# Patient Record
Sex: Male | Born: 1975 | Race: White | Hispanic: No | Marital: Single | Smoking: Current every day smoker
Health system: Southern US, Community
[De-identification: ages and names within clinical notes are randomized; demographics above are authoritative.]

## PROBLEM LIST (undated history)

## (undated) DIAGNOSIS — F419 Anxiety disorder, unspecified: Secondary | ICD-10-CM

## (undated) DIAGNOSIS — F329 Major depressive disorder, single episode, unspecified: Secondary | ICD-10-CM

## (undated) DIAGNOSIS — R200 Anesthesia of skin: Secondary | ICD-10-CM

## (undated) DIAGNOSIS — F32A Depression, unspecified: Secondary | ICD-10-CM

## (undated) HISTORY — DX: Depression, unspecified: F32.A

## (undated) HISTORY — DX: Anxiety disorder, unspecified: F41.9

## (undated) HISTORY — DX: Anesthesia of skin: R20.0

## (undated) HISTORY — DX: Major depressive disorder, single episode, unspecified: F32.9

## (undated) HISTORY — PX: FRACTURE SURGERY: SHX138

---

## 2005-07-11 ENCOUNTER — Ambulatory Visit: Payer: Self-pay | Admitting: Podiatry

## 2009-07-24 ENCOUNTER — Inpatient Hospital Stay: Payer: Self-pay | Admitting: Podiatry

## 2011-03-02 IMAGING — US US SOFT TISSUE EXCLUDE HEAD/NECK
1 series · 15 of 15 positions shown · non-contrast
Comparison: none

REASON FOR EXAM: foreign body
COMMENTS:

PROCEDURE:     US  - US SOFT TISSUE, NOT NECK /  HEAD  - July 24, 2009 [DATE]
RESULT:     Focus left foot ultrasound dated 07/24/2009.
TECHNIQUE: Post evaluation left foot was obtained ultrasound.

[Series 1: us soft tissue exclude head/neck · 15 of 15 slices shown]
[im 1/15]
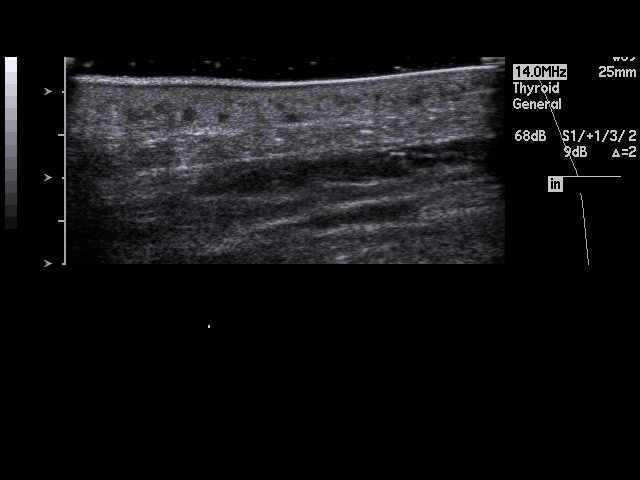
[im 2/15]
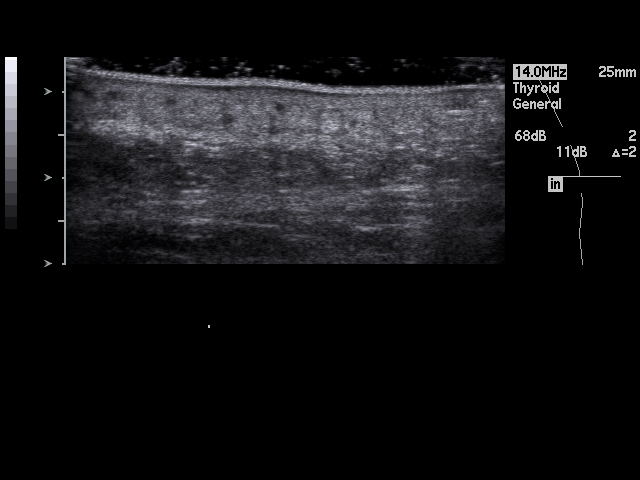
[im 3/15]
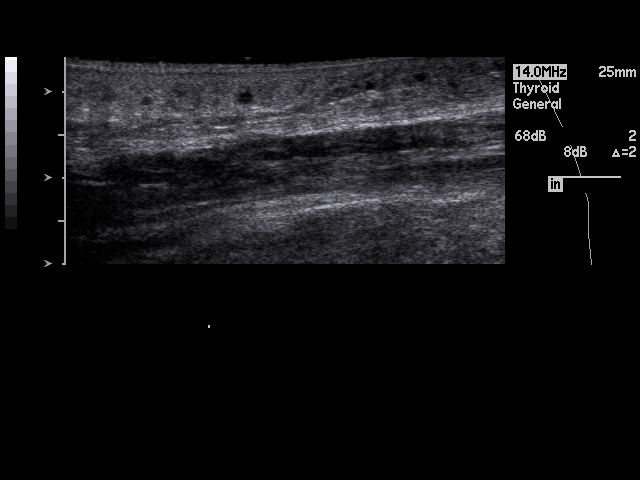
[im 4/15]
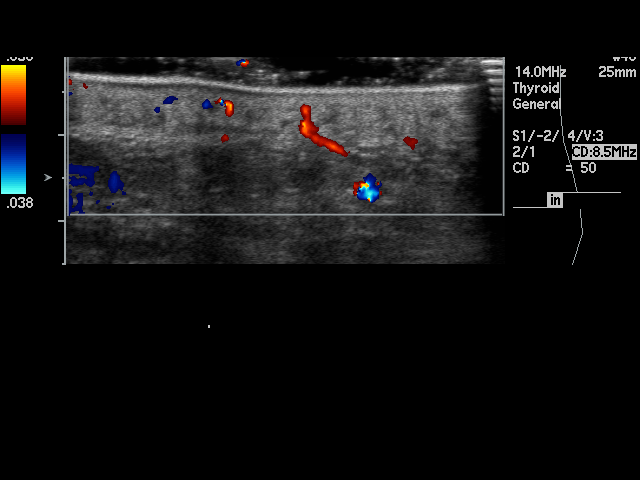
[im 5/15]
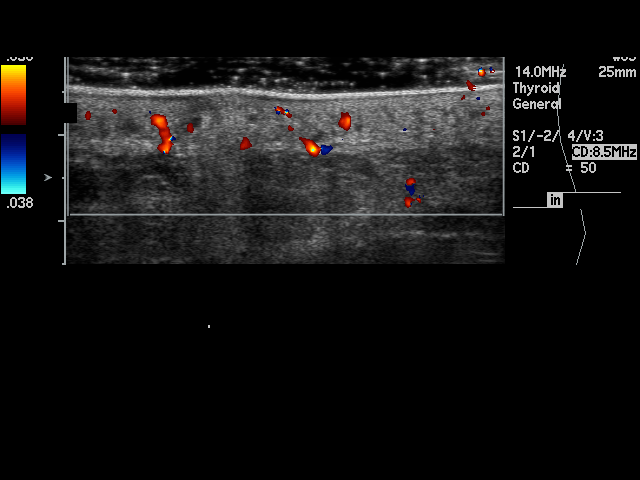
[im 6/15]
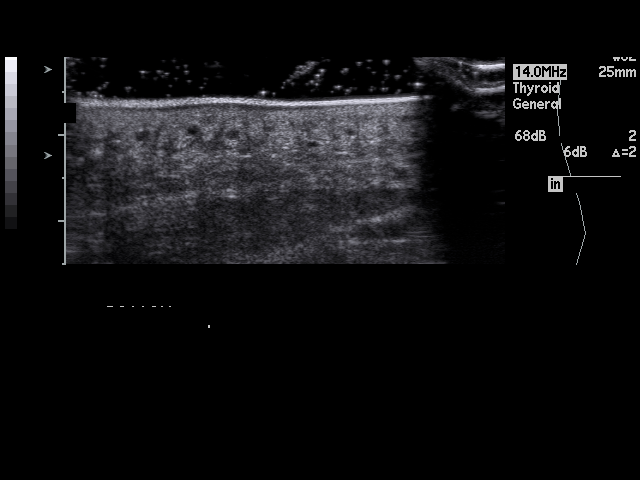
[im 7/15]
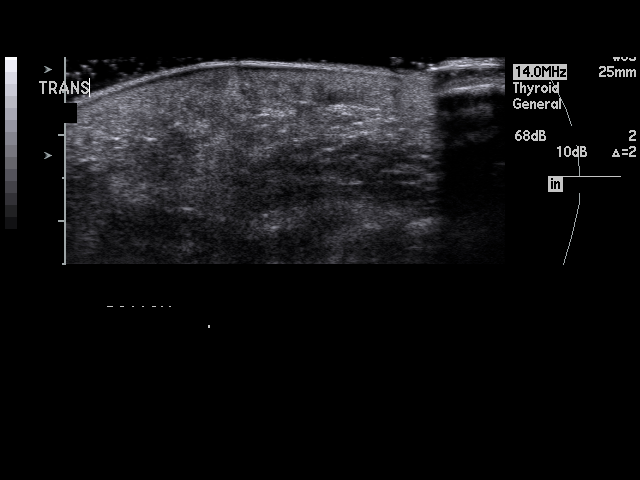
[im 8/15]
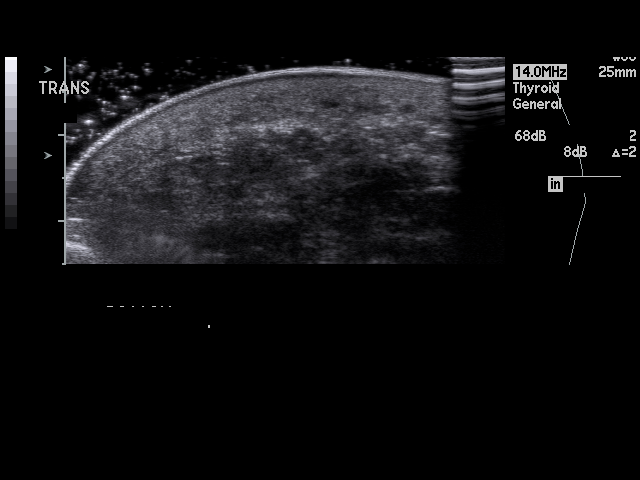
[im 9/15]
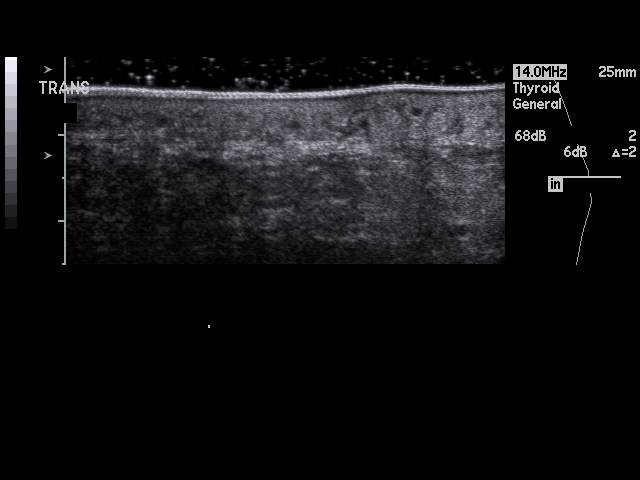
[im 10/15]
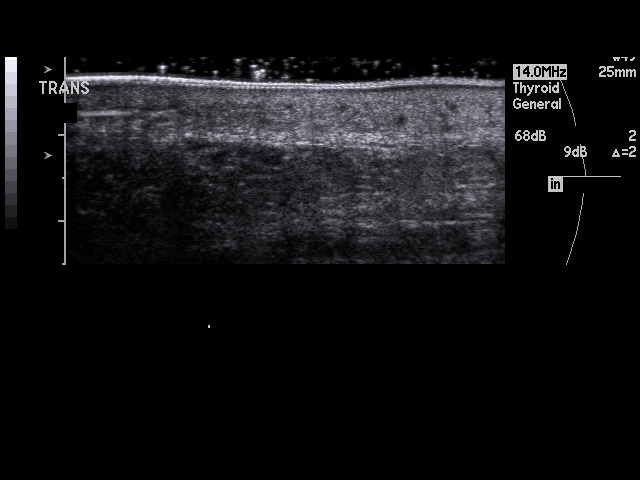
[im 11/15]
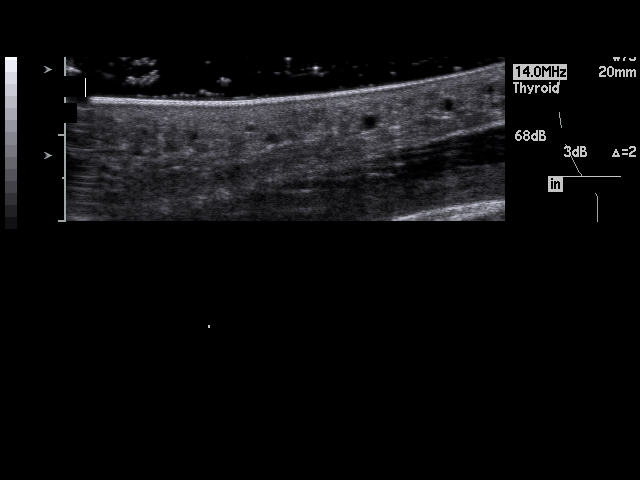
[im 12/15]
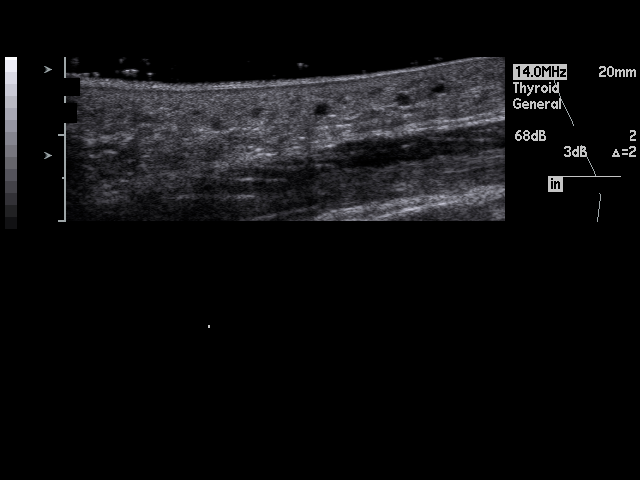
[im 13/15]
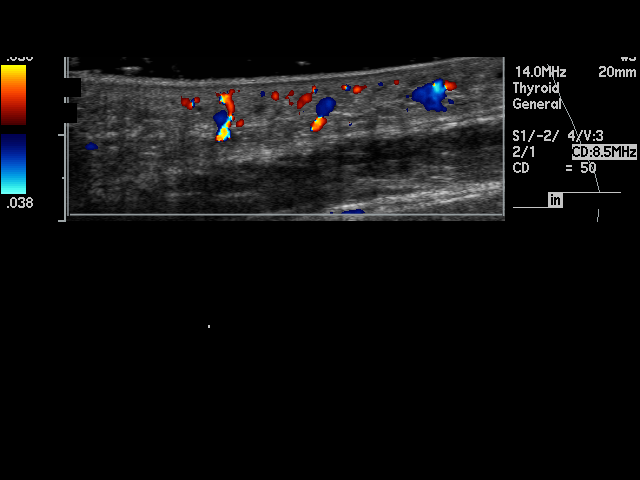
[im 14/15]
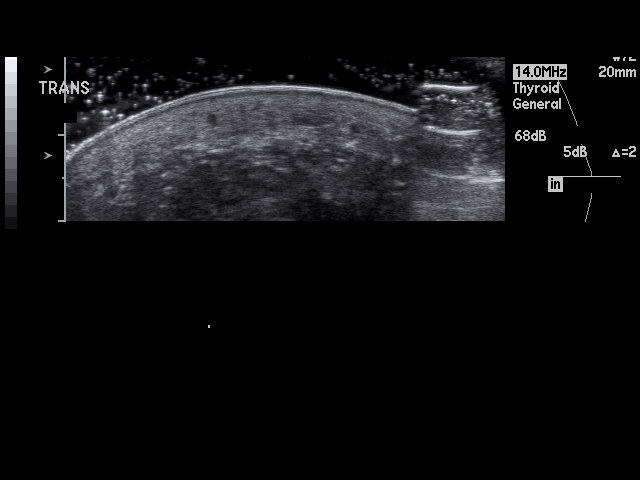
[im 15/15]
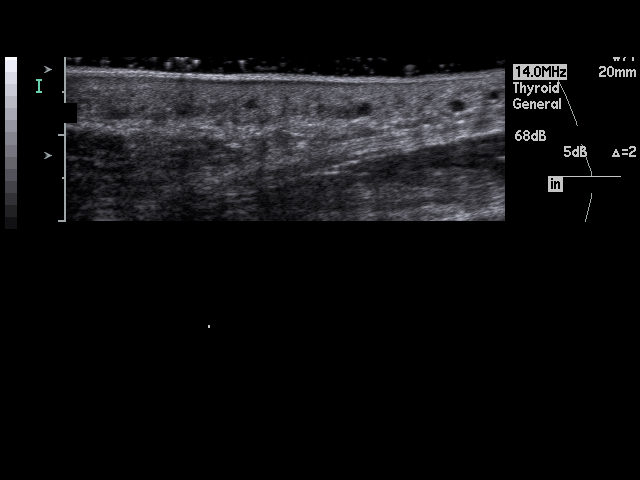

[15 of 15 positions shown; findings below may reference images not displayed]

FINDINGS: No solid or cystic sonographic abnormalities identified. No areas
of increased echogenicity nor hypotonicity identified to suggest sequela of
a foreign body. The interrogated soft tissues demonstrate no sonographic
abnormalities.
IMPRESSION: Unremarkable focused ultrasound of the left foot.

## 2014-08-25 DIAGNOSIS — F909 Attention-deficit hyperactivity disorder, unspecified type: Secondary | ICD-10-CM | POA: Insufficient documentation

## 2016-06-16 ENCOUNTER — Emergency Department
Admission: EM | Admit: 2016-06-16 | Discharge: 2016-06-16 | Disposition: A | Discharge status: Home / Self Care | Payer: No Typology Code available for payment source | Attending: Emergency Medicine | Admitting: Emergency Medicine

## 2016-06-16 DIAGNOSIS — M7918 Myalgia, other site: Secondary | ICD-10-CM

## 2016-06-16 DIAGNOSIS — M546 Pain in thoracic spine: Secondary | ICD-10-CM | POA: Diagnosis not present

## 2016-06-16 DIAGNOSIS — S161XXA Strain of muscle, fascia and tendon at neck level, initial encounter: Secondary | ICD-10-CM | POA: Diagnosis not present

## 2016-06-16 DIAGNOSIS — Y939 Activity, unspecified: Secondary | ICD-10-CM | POA: Diagnosis not present

## 2016-06-16 DIAGNOSIS — S199XXA Unspecified injury of neck, initial encounter: Secondary | ICD-10-CM | POA: Diagnosis present

## 2016-06-16 DIAGNOSIS — Y92410 Unspecified street and highway as the place of occurrence of the external cause: Secondary | ICD-10-CM | POA: Diagnosis not present

## 2016-06-16 DIAGNOSIS — Y999 Unspecified external cause status: Secondary | ICD-10-CM | POA: Insufficient documentation

## 2016-06-16 DIAGNOSIS — M25511 Pain in right shoulder: Secondary | ICD-10-CM | POA: Insufficient documentation

## 2016-06-16 DIAGNOSIS — F17210 Nicotine dependence, cigarettes, uncomplicated: Secondary | ICD-10-CM | POA: Insufficient documentation

## 2016-06-16 MED ORDER — KETOROLAC TROMETHAMINE 60 MG/2ML IM SOLN
60.0000 mg | Freq: Once | INTRAMUSCULAR | Status: AC
  Administered 2016-06-16: 60 mg via INTRAMUSCULAR
  Filled 2016-06-16: qty 2

## 2016-06-16 MED ORDER — TRAMADOL HCL 50 MG PO TABS: 50.0000 mg | ORAL_TABLET | Freq: Four times a day (QID) | ORAL | 0 refills | Status: AC | PRN

## 2016-06-16 MED ORDER — IBUPROFEN 600 MG PO TABS: 600.0000 mg | ORAL_TABLET | Freq: Three times a day (TID) | ORAL | 0 refills | Status: AC | PRN

## 2016-06-16 MED ORDER — METHOCARBAMOL 750 MG PO TABS: 750.0000 mg | ORAL_TABLET | Freq: Four times a day (QID) | ORAL | 0 refills | Status: DC
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
## 2018-06-22 ENCOUNTER — Ambulatory Visit: Payer: Self-pay

## 2018-06-24 ENCOUNTER — Ambulatory Visit: Payer: Self-pay | Admitting: Adult Health Nurse Practitioner

## 2018-06-24 DIAGNOSIS — Z0000 Encounter for general adult medical examination without abnormal findings: Secondary | ICD-10-CM

## 2018-06-24 DIAGNOSIS — F331 Major depressive disorder, recurrent, moderate: Secondary | ICD-10-CM

## 2018-06-24 DIAGNOSIS — F419 Anxiety disorder, unspecified: Secondary | ICD-10-CM | POA: Insufficient documentation

## 2018-06-24 DIAGNOSIS — F32A Depression, unspecified: Secondary | ICD-10-CM | POA: Insufficient documentation

## 2018-06-24 DIAGNOSIS — F329 Major depressive disorder, single episode, unspecified: Secondary | ICD-10-CM | POA: Insufficient documentation

## 2018-06-24 MED ORDER — SERTRALINE HCL 50 MG PO TABS: 50.0000 mg | ORAL_TABLET | Freq: Every day | ORAL | 1 refills | Status: DC

## 2018-06-24 NOTE — Progress Notes (Signed)
Patient: Wayne PinksScott A Ratti Male    DOB: 28-Jan-1976   42 y.o.   MRN: [id number] Visit Date: 06/24/2018  Today's Provider: Jacelyn Pieah Doles-Johnson, NP   Chief Complaint  Patient presents with  . Medication Refill    patient needs medications, patient is requesting adderall   Subjective:    HPI  Recently moved here from [place], not by his own choice.  States that he was waiting to see a Psychiatrist  Pt states that he is about to run out of medications- states that his anxiety is high because his wife recently left with his one year old son and sold their home in [place] and now he is back in [place] living with his parents until he can get on his feet.  He feels like he is starting over again.  Denies SI/HI.   Used to see Dr. Terance HartBronstein in the past.  Takes Ativan PRN- but recently taking more consistently due to his situation.  States he needs Adderall to focus and manage his daily life.  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       No Known Allergies Previous Medications   IBUPROFEN (ADVIL,MOTRIN) 600 MG TABLET    Take 1 tablet (600 mg total) by mouth every 8 (eight) hours as needed.   LORAZEPAM (ATIVAN) 0.5 MG TABLET    Take 0.5 mg by mouth every 8 (eight) hours.   METHOCARBAMOL (ROBAXIN-750) 750 MG TABLET    Take 1 tablet (750 mg total) by mouth 4 (four) times daily.   NIFEDIPINE (ADALAT CC) 30 MG 24 HR TABLET    Take 30 mg by mouth daily.   SERTRALINE (ZOLOFT) 50 MG TABLET    Take 50 mg by mouth daily.    Review of Systems  All other systems reviewed and are negative.   Social History   Tobacco Use  . Smoking status: Current Every Day Smoker    Packs/day: 0.50    Types: Cigarettes  . Smokeless tobacco: Never Used  Substance Use Topics  . Alcohol use: Yes    Alcohol/week: 6.0 standard drinks    Types: 6 Cans of beer per week   Objective:   BP 139/89 (BP Location: Left Arm, Patient Position: Sitting, Cuff Size: Normal)   Pulse (!) 107   Temp  97.6 F (36.4 C) (Oral)   Ht 6\' 1"  (1.854 m)   Wt 165 lb 9.6 oz (75.1 kg)   BMI 21.85 kg/m   Physical Exam Vitals signs reviewed.  Constitutional:      Appearance: Normal appearance.  HENT:     Head: Normocephalic.  Cardiovascular:     Rate and Rhythm: Normal rate and regular rhythm.  Pulmonary:     Effort: Pulmonary effort is normal.     Breath sounds: Normal breath sounds.  Abdominal:     General: Bowel sounds are normal.     Palpations: Abdomen is soft.  Skin:    General: Skin is warm and dry.  Neurological:     Mental Status: He is alert and oriented to person, place, and time.  Psychiatric:     Comments: Mildly anxious         Assessment & Plan:        [facility]  Will RX for Sertraline.   Routine labs ordered. Will call with lab results.  FU as needed.      Jacelyn Pieah Doles-Johnson, NP   [facility]

## 2018-06-25 LAB — COMPREHENSIVE METABOLIC PANEL
ALT: 13 IU/L (ref 0–44)
AST: 19 IU/L (ref 0–40)
Albumin/Globulin Ratio: 1.4 (ref 1.2–2.2)
Albumin: 4.6 g/dL (ref 3.5–5.5)
Alkaline Phosphatase: 69 IU/L (ref 39–117)
BUN/Creatinine Ratio: 17 (ref 9–20)
BUN: 14 mg/dL (ref 6–24)
Bilirubin Total: 0.4 mg/dL (ref 0.0–1.2)
CO2: 26 mmol/L (ref 20–29)
Calcium: 9.6 mg/dL (ref 8.7–10.2)
Chloride: 99 mmol/L (ref 96–106)
Creatinine, Ser: 0.84 mg/dL (ref 0.76–1.27)
GFR calc non Af Amer: 108 mL/min/{1.73_m2} (ref >59)
GFR, EST AFRICAN AMERICAN: 125 mL/min/{1.73_m2} (ref >59)
GLOBULIN, TOTAL: 3.2 g/dL (ref 1.5–4.5)
Glucose: 93 mg/dL (ref 65–99)
Potassium: 4.3 mmol/L (ref 3.5–5.2)
Sodium: 142 mmol/L (ref 134–144)
Total Protein: 7.8 g/dL (ref 6.0–8.5)

## 2018-06-25 LAB — TSH: TSH: 0.798 u[IU]/mL (ref 0.450–4.500)

## 2018-06-25 LAB — CBC
Hematocrit: 45.3 % (ref 37.5–51.0)
Hemoglobin: 15.5 g/dL (ref 13.0–17.7)
MCH: 32 pg (ref 26.6–33.0)
MCHC: 34.2 g/dL (ref 31.5–35.7)
MCV: 93 fL (ref 79–97)
Platelets: 266 10*3/uL (ref 150–450)
RBC: 4.85 x10E6/uL (ref 4.14–5.80)
RDW: 13 % (ref 12.3–15.4)
WBC: 9.3 10*3/uL (ref 3.4–10.8)

## 2018-06-25 LAB — LIPID PANEL
Chol/HDL Ratio: 3.5 ratio (ref 0.0–5.0)
Cholesterol, Total: 219 mg/dL — ABNORMAL HIGH (ref 100–199)
HDL: 63 mg/dL (ref >39)
LDL Calculated: 141 mg/dL — ABNORMAL HIGH (ref 0–99)
Triglycerides: 75 mg/dL (ref 0–149)
VLDL Cholesterol Cal: 15 mg/dL (ref 5–40)

## 2018-06-29 ENCOUNTER — Ambulatory Visit: Payer: Self-pay | Admitting: Licensed Clinical Social Worker

## 2018-06-29 DIAGNOSIS — F411 Generalized anxiety disorder: Secondary | ICD-10-CM

## 2018-06-29 DIAGNOSIS — F331 Major depressive disorder, recurrent, moderate: Secondary | ICD-10-CM

## 2018-06-29 NOTE — BH Specialist Note (Signed)
Integrated Behavioral Health Comprehensive Clinical Assessment  MRN: [id number] Name: DERICO MITTON  Type of Service: Integrated Behavioral Health-Individual Interpretor: No. Interpretor Name and Language: Not applicable.  PRESENTING CONCERNS: SOHAIL CAPRARO is a 42 y.o. male accompanied by himself.Lars Pinks was referred to [facility] clinician for mental health.  Previous mental health services Have you ever been treated for a mental health problem? Yes If "Yes", when were you treated and whom did you see? Mr. Wach has only been treated by his former primary care doctor who prescribed Adderall 20 mg daily fast acting and Sertraline 50 mg daily for anxiety and depression. Have you ever been hospitalized for mental health treatment? No Have you ever been treated for any of the following? Past Psychiatric History/Hospitalization(s): Anxiety: Yes Mr. Bushey reports that his anxiety started about four in half to five years ago around the time of his divorce. He describes excessive worrying daily, worrying too much about things, trouble relaxing, feeling so restless its hard to sit still, becoming easily annoyed or irritable, and feeling afraid as if something awful might happen. Bipolar Disorder: Negative Depression: Yes Mr. Shatto reports that his symptoms of depression started about four in half to five years ago as evidenced by feeling down and depressed more than half the day, loss of interest in previously enjoyed activities, increased appetite, difficulty concentrating, and restlessness. He denies suicidal and homicidal thoughts.  Mania: Negative Psychosis: Negative Schizophrenia: Negative Personality Disorder: Negative Hospitalization for psychiatric illness: Negative History of Electroconvulsive Shock Therapy: Negative Prior Suicide Attempts: Negative Have you ever had thoughts of harming yourself or others or attempted suicide? No plan to harm self or  others  Medical history  has a past medical history of Anxiety and Depression. Primary Care Physician: Patient, No Pcp Per Date of last physical exam: 06/24/18 Allergies: No Known Allergies Current medications:  Outpatient Encounter Medications as of 06/29/2018  Medication Sig  . ibuprofen (ADVIL,MOTRIN) 600 MG tablet Take 1 tablet (600 mg total) by mouth every 8 (eight) hours as needed.  Marland Kitchen LORazepam (ATIVAN) 0.5 MG tablet Take 0.5 mg by mouth every 8 (eight) hours.  . methocarbamol (ROBAXIN-750) 750 MG tablet Take 1 tablet (750 mg total) by mouth 4 (four) times daily. (Patient not taking: Reported on 06/24/2018)  . NIFEdipine (ADALAT CC) 30 MG 24 hr tablet Take 30 mg by mouth daily.  . sertraline (ZOLOFT) 50 MG tablet Take 1 tablet (50 mg total) by mouth daily.   No facility-administered encounter medications on file as of 06/29/2018.    Have you ever had any serious medication reactions? No Is there any history of mental health problems or substance abuse in your family? No Has anyone in your family been hospitalized for mental health treatment? No  Social/family history Who lives in your current household? Mr. Deal lives with his parents in a house they own. What is your family of origin, childhood history? Mr. Doo is from [place] . Where were you born? Mr. Ritthaler was born and raised in [place]. Where did you grow up? Mr. Alms raised in [place]. How many different homes have you lived in? A few Describe your childhood: Mr. Haynesworth describes his childhood as being wonderful.  Do you have siblings, step/half siblings? Yes- He has a sister and they are four years apart in age. What are their names, relation, sex, age? See above Are your parents separated or divorced? No What are your social supports? Mr. Defranco has  the support of his family and two best friends.  Education How many grades have you completed? 11th grade Did you have any problems in school?  No  Employment/financial issues Mr. 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   Sleep Usual bedtime varies.  Sleeping arrangements: Mr. Irving BurtonHodge sleeps alone. Problems with snoring: Not known Obstructive sleep apnea is not a concern. Problems with nightmares: No Problems with night terrors: No Problems with sleepwalking: No  Trauma/Abuse history Have you ever experienced or been exposed to any form of abuse? No Have you ever experienced or been exposed to something traumatic? No  Substance use Do you use alcohol, nicotine or caffeine? tobacco use: 0.5 cigarettes on and off for several years. How old were you when you first tasted alcohol? Teens. Have you ever used illicit drugs or abused prescription medications? Mr. Irving BurtonHodge denies using or abusing alcohol or other drugs.  Mental status General appearance/Behavior: Casual Eye contact: Fair Motor behavior: Restlestness Speech: Normal Level of consciousness: Alert Mood: Euthymic Affect: Appropriate Anxiety level: Minimal Thought process: Coherent Thought content: WNL Perception: Normal Judgment: Good Insight: Present  Diagnosis No diagnosis found.  GOALS ADDRESSED: Patient will reduce symptoms of: anxiety, depression and stress and increase knowledge and/or ability of: self-management skills and stress reduction and also: Increase healthy adjustment to current life circumstances              INTERVENTIONS: Interventions utilized: Biopsychosocial assessment completed. Standardized Assessments completed: GAD-7 and PHQ 9   ASSESSMENT/OUTCOME:  Avaneesh A. Irving BurtonHodge is a 42 year old Caucasian male who presents today for a mental health assessment and was referred by Dellie Burnseah Doles Johnon NP at [facility]. Mr. Irving BurtonHodge reports that he was previously seeing his primary care doctor for mental health until he lost his job and health insurance. He reports  that Dr. Terance HartBronstein was prescribing him Sertraline 50 mg daily for depression and anxiety and Adderall 20 mg daily fast acting for his symptoms of ADHD. He reports that he was diagnosed with ADHD in third grade. He reports that he has been on Sertraline and Adderall for 13 to 14 years. He reports that his depression and anxiety started around the time of his divorce four in half to five years ago. He denies ever being hospitalized for mental illness or substance abuse. He denies ever abusing alcohol or other drugs. He denies any symptoms of psychosis. He has not previously seen a therapist or psychiatrist for mental health.  Mr. Myrtie NeitherHodge's medical history is unremarkable. He is a new patient at [facility]. He denies any allergies or adverse drug reactions.   Mr. Irving BurtonHodge lives with his parents. 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 He relies on his parents to help him financially. He reports that he will occasionally get odd jobs from friends who own their own businesses. 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 He was previously married for twenty two years and went through a divorce four in half to five years ago. He has two children. One child from his first marriage who is [id number] and another son who is one from his fiance of four years. He reports that his fiance left him in November of this year, sold the house, sold his stuff, and took their one year  old son. He explains that he has contacted her family and found out that she is living in [place] but she refuses to speak to him. He plans to get a lawyer to pursue custody. He relies on the support of his parents and two best friends. He denies a history of mental illness or substance abuse in his family.   PLAN: Referral to [facility] for seeing a psychiatrist about Adderall. Sertraline prescription prescribed by Jacelyn Pi NP at [facility] until Mr. Artola can establish care  at [facility].   Scheduled next visit: no visit scheduled.   Althia Forts Clinical Social Work

## 2018-07-22 ENCOUNTER — Ambulatory Visit: Payer: Self-pay | Admitting: Urology

## 2018-07-22 VITALS — BP 121/69 | HR 87 | Temp 97.7°F | Ht 73.25 in | Wt 171.9 lb

## 2018-07-22 DIAGNOSIS — E7800 Pure hypercholesterolemia, unspecified: Secondary | ICD-10-CM

## 2018-07-22 NOTE — Progress Notes (Signed)
Patient: Wayne Sullivan Male    DOB: [birth year]   43 y.o.   MRN: [id number] Visit Date: 07/22/2018  Today's Provider: Michiel Cowboy, PA-C   Chief Complaint  Patient presents with  . Follow-up    cholesterol check  . Numbness    left arm numbness   Subjective:    HPI He states that his two middle fingers turn purple and go numb and the tingling sensation travels up his ulnar side to his upper arm to the shoulder.  He states during one episode of numbness he was able to stick a needle into his deltoid and not feel it.  He states that this has been happening since he was 43 years old.  He was prescribed nifedipine for the symptoms.    He has not had any fevers or chills.  WBC count was 9.3 on 07/02/2018.    His total cholesterol is 219.  His LDL was 141 and HDL 63 mL.    BP was equal in both arms     No Known Allergies Previous Medications   IBUPROFEN (ADVIL,MOTRIN) 600 MG TABLET    Take 1 tablet (600 mg total) by mouth every 8 (eight) hours as needed.   LORAZEPAM (ATIVAN) 0.5 MG TABLET    Take 0.5 mg by mouth every 8 (eight) hours.   METHOCARBAMOL (ROBAXIN-750) 750 MG TABLET    Take 1 tablet (750 mg total) by mouth 4 (four) times daily.   NIFEDIPINE (ADALAT CC) 30 MG 24 HR TABLET    Take 30 mg by mouth daily.   SERTRALINE (ZOLOFT) 50 MG TABLET    Take 1 tablet (50 mg total) by mouth daily.    Review of Systems  Constitutional: Negative.   HENT: Negative.   Eyes: Negative.   Respiratory: Negative.   Cardiovascular: Negative.   Gastrointestinal: Negative.   Endocrine: Negative.   Genitourinary: Negative.   Musculoskeletal: Negative.   Skin: Negative.   Allergic/Immunologic: Negative.   Neurological: Positive for numbness.       Intermittent left arm numbness for several years   Hematological: Negative.   Psychiatric/Behavioral: The patient is nervous/anxious.     Social History   Tobacco Use  . Smoking status: Current Every Day Smoker    Packs/day: 0.50    Types:  Cigarettes  . Smokeless tobacco: Never Used  Substance Use Topics  . Alcohol use: Yes    Alcohol/week: 6.0 standard drinks    Types: 6 Cans of beer per week   Objective:   BP 121/69 (BP Location: Left Arm, Patient Position: Sitting, Cuff Size: Normal)   Pulse 87   Temp 97.7 F (36.5 C) (Oral)   Ht 6' 1.25" (1.861 m)   Wt 171 lb 14.4 oz (78 kg)   BMI 22.52 kg/m   Physical Exam Constitutional:      Appearance: Normal appearance. He is normal weight.  HENT:     Head: Normocephalic and atraumatic.     Nose: Nose normal.     Mouth/Throat:     Mouth: Mucous membranes are moist.  Eyes:     Extraocular Movements: Extraocular movements intact.     Conjunctiva/sclera: Conjunctivae normal.     Pupils: Pupils are equal, round, and reactive to light.  Neck:     Musculoskeletal: Normal range of motion and neck supple.  Cardiovascular:     Rate and Rhythm: Regular rhythm.     Pulses: Normal pulses.     Heart sounds: Normal heart sounds.  Pulmonary:  Effort: Pulmonary effort is normal.     Breath sounds: Normal breath sounds.  Abdominal:     General: Abdomen is flat. Bowel sounds are normal.  Neurological:     Mental Status: He is alert.  Psychiatric:        Mood and Affect: Mood normal.        Behavior: Behavior normal.        Thought Content: Thought content normal.        Judgment: Judgment normal.         Assessment & Plan:   1. ?Cervical radiculopathy  - appt. With Dr. Justice RocherFossier   2.  ADHD  - needs to get his Adderall   - states [facility] refused to give him the medication    3. High cholesterol  - discussed diet and advised to follow a low cholesterol diet  - recheck lipids in one month         Michiel CowboySHANNON Maddyn Lieurance, PA-C   [facility]

## 2018-07-22 NOTE — Patient Instructions (Signed)
Cholesterol  Cholesterol is a fat. Your body needs a small amount of cholesterol. Cholesterol (plaque) may build up in your blood vessels (arteries). That makes you more likely to have a heart attack or stroke. You cannot feel your cholesterol level. Having a blood test is the only way to find out if your level is high. Keep your test results. Work with your doctor to keep your cholesterol at a good level. What do the results mean?  Total cholesterol is how much cholesterol is in your blood.  LDL is bad cholesterol. This is the type that can build up. Try to have low LDL.  HDL is good cholesterol. It cleans your blood vessels and carries LDL away. Try to have high HDL.  Triglycerides are fat that the body can store or burn for energy. What are good levels of cholesterol?  Total cholesterol below 200.  LDL below 100 is good for people who have health risks. LDL below 70 is good for people who have very high risks.  HDL above 40 is good. It is best to have HDL of 60 or higher.  Triglycerides below 150. How can I lower my cholesterol? Diet Follow your diet program as told by your doctor.  Choose fish, white meat chicken, or [place] that is roasted or baked. Try not to eat red meat, fried foods, sausage, or lunch meats.  Eat lots of fresh fruits and vegetables.  Choose whole grains, beans, pasta, potatoes, and cereals.  Choose olive oil, corn oil, or canola oil. Only use small amounts.  Try not to eat butter, mayonnaise, shortening, or palm kernel oils.  Try not to eat foods with trans fats.  Choose low-fat or nonfat dairy foods. ? Drink skim or nonfat milk. ? Eat low-fat or nonfat yogurt and cheeses. ? Try not to drink whole milk or cream. ? Try not to eat ice cream, egg yolks, or full-fat cheeses.  Healthy desserts include angel food cake, ginger snaps, animal crackers, hard candy, popsicles, and low-fat or nonfat frozen yogurt. Try not to eat pastries, cakes, pies, and  cookies.  Exercise Follow your exercise program as told by your doctor.  Be more active. Try gardening, walking, and taking the stairs.  Ask your doctor about ways that you can be more active. Medicine  Take over-the-counter and prescription medicines only as told by your doctor. This information is not intended to replace advice given to you by your health care provider. Make sure you discuss any questions you have with your health care provider. Document Released: 09/26/2008 Document Revised: 01/30/2016 Document Reviewed: 01/10/2016 Elsevier Interactive Patient Education  2019 [employer].  Cholesterol Content in Foods Cholesterol is a waxy, fat-like substance that helps to carry fat in the blood. The body needs cholesterol in small amounts, but too much cholesterol can cause damage to the arteries and heart. Most people should eat less than 200 milligrams (mg) of cholesterol a day. Foods with cholesterol  Cholesterol is found in animal-based foods, such as meat, seafood, and dairy. Generally, low-fat dairy and lean meats have less cholesterol than full-fat dairy and fatty meats. The milligrams of cholesterol per serving (mg per serving) of common cholesterol-containing foods are listed below. Meat and other proteins  Egg - one large whole egg has 186 mg.  Veal shank - 4 oz has 141 mg.  Lean ground [place] (93% lean) - 4 oz has 118 mg.  Fat-trimmed lamb loin - 4 oz has 106 mg.  Lean ground beef (90%  Egg -- one large whole egg has 186 mg.   Veal shank -- 4 oz has 141 mg.   Lean ground turkey (93% lean) -- 4 oz has 118 mg.   Fat-trimmed lamb loin -- 4 oz has 106 mg.   Lean ground beef (90% lean) -- 4 oz has 100 mg.   Lobster -- 3.5 oz has 90 mg.   Pork loin chops -- 4 oz has 86 mg.   Canned salmon -- 3.5 oz has 83 mg.   Fat-trimmed beef top loin -- 4 oz has 78 mg.   Frankfurter -- 1 frank (3.5 oz) has 77 mg.   Crab -- 3.5 oz has 71 mg.   Roasted chicken without skin, white meat -- 4 oz has 66 mg.   Light bologna -- 2 oz has 45 mg.   Deli-cut turkey -- 2 oz has 31 mg.   Canned tuna -- 3.5 oz has 31 mg.   Bacon -- 1 oz has 29 mg.   Oysters and mussels (raw) --  3.5 oz has 25 mg.   Mackerel -- 1 oz has 22 mg.   Trout -- 1 oz has 20 mg.   Pork sausage -- 1 link (1 oz) has 17 mg.   Salmon -- 1 oz has 16 mg.   Tilapia -- 1 oz has 14 mg.  Dairy   Soft-serve ice cream --  cup (4 oz) has 103 mg.   Whole-milk yogurt -- 1 cup (8 oz) has 29 mg.   Cheddar cheese -- 1 oz has 28 mg.   American cheese -- 1 oz has 28 mg.   Whole milk -- 1 cup (8 oz) has 23 mg.   2% milk -- 1 cup (8 oz) has 18 mg.   Cream cheese -- 1 tablespoon (Tbsp) has 15 mg.   Cottage cheese --  cup (4 oz) has 14 mg.   Low-fat (1%) milk -- 1 cup (8 oz) has 10 mg.   Sour cream -- 1 Tbsp has 8.5 mg.   Low-fat yogurt -- 1 cup (8 oz) has 8 mg.   Nonfat Greek yogurt -- 1 cup (8 oz) has 7 mg.   Half-and-half cream -- 1 Tbsp has 5 mg.  Fats and oils   Cod liver oil -- 1 tablespoon (Tbsp) has 82 mg.   Butter -- 1 Tbsp has 15 mg.   Lard -- 1 Tbsp has 14 mg.   Bacon grease -- 1 Tbsp has 14 mg.   Mayonnaise -- 1 Tbsp has 5-10 mg.   Margarine -- 1 Tbsp has 3-10 mg.  Exact amounts of cholesterol in these foods may vary depending on specific ingredients and brands.  Foods without cholesterol  Most plant-based foods do not have cholesterol unless you combine them with a food that has cholesterol. Foods without cholesterol include:   Grains and cereals.   Vegetables.   Fruits.   Vegetable oils, such as olive, canola, and sunflower oil.   Legumes, such as peas, beans, and lentils.   Nuts and seeds.   Egg whites.  Summary   The body needs cholesterol in small amounts, but too much cholesterol can cause damage to the arteries and heart.   Most people should eat less than 200 milligrams (mg) of cholesterol a day.  This information is not intended to replace advice given to you by your health care provider. Make sure you discuss any questions you have with your health care provider.  Document Released: 02/24/2017

## 2018-07-29 ENCOUNTER — Ambulatory Visit: Payer: Self-pay | Admitting: Licensed Clinical Social Worker

## 2018-07-29 DIAGNOSIS — F331 Major depressive disorder, recurrent, moderate: Secondary | ICD-10-CM

## 2018-07-29 DIAGNOSIS — F411 Generalized anxiety disorder: Secondary | ICD-10-CM

## 2018-07-29 NOTE — BH Specialist Note (Signed)
Integrated Behavioral Health Follow Up Visit  MRN: [id number] Name: Wayne Wayne  Number of Integrated Behavioral Health Clinician visits: 2/6   Type of Service: Integrated Behavioral Health- Individual/Family Interpretor:No. Interpretor Name and Language: Not applicable.  SUBJECTIVE: Wayne Wayne is a 43 y.o. male accompanied by himself. Patient was referred by Michiel Cowboy PA for mental health. Patient reports the following symptoms/concerns: He reports that things with [facility] were a bust and was told that he didn't need any help. He notes that he starts his new job at the end of the month and should have health insurance after sixty days so he will find a provider to prescribe him ADHD medication then. He explains that he is working with an Pensions consultant on building a case against his ex. He recently found out that his ex has been kicked out of her parent's house and his son is in the care of the maternal grandparents. He explains that he receives an update every two days. He notes that he is writing his journal and trying to stay busy. He notes that his mood has been okay and is looking at a place to rent. He denies suicidal and homicidal thoughts.  Duration of problem:  Severity of problem: moderate  OBJECTIVE: Mood: Anxious and Affect: Appropriate Risk of harm to self or others: No plan to harm self or others  LIFE CONTEXT: Family and Social: Mr. Wildeman is working with an attorney to get right to see his two year old son. School/Work: Mr. Haataja is starting a full time job at the end of the month. Self-Care: Mr. Heineman is trying to stay busy. Life Changes: See above.  GOALS ADDRESSED: Patient will: 1.  Reduce symptoms of: depression  2.  Increase knowledge and/or ability of: coping skills and healthy habits  3.  Demonstrate ability to: Increase healthy adjustment to current life circumstances  INTERVENTIONS: Interventions utilized:  Brief CBT was utilized by the clinician focusing on  his mood and affect on behavior. Clinician explained that she is surprised to hear about the experience he had at [facility] but sounds like he can manage for the next few months. Clinician congratulated the patient on his new job. Clinician explained that with custody cases that typically it takes a few months to a year to straighten out. Clinician encouraged the patient to focus on the positive of the fact that his son is safe and away from any harm.  Standardized Assessments completed: GAD-7 and PHQ 9 Anxiety is at an 8 and depression is at a 9. ASSESSMENT: Patient currently experiencing symptoms of depression due to not being able to see his son and living with his parents.  Patient may benefit from continuing with therapy.  PLAN: 1. Follow up with behavioral health clinician on : 1 month 2. Behavioral recommendations: Practice self care and continue to utilize coping skills. 3. Referral(s): Integrated [facility] [facility] (In Clinic) 4. "From scale of 1-10, how likely are you to follow plan?": 10  Althia Forts, LCSW

## 2018-08-10 ENCOUNTER — Ambulatory Visit: Payer: Self-pay | Admitting: Specialist

## 2018-08-10 DIAGNOSIS — R202 Paresthesia of skin: Principal | ICD-10-CM

## 2018-08-10 DIAGNOSIS — R200 Anesthesia of skin: Secondary | ICD-10-CM

## 2018-08-10 NOTE — Progress Notes (Signed)
  Subjective:     Patient ID: Wayne Sullivan, male   DOB: 01-Jun-1976, 43 y.o.   MRN: [id number]  HPI 43 year old with a complaint of intermittent numbness throughout the left arm into the IF, MF, and RF. He has constant numbness in the LUE but occasionally it becomes worse where he drops items. Since moving back to [facility]. it is happening more often. (Possibly cold weather). His job is mainly sedentary. He is a smoker. Review of Systems Deferred    Objective:   Physical Exam  Neck: FROM. Spurling's test to the right causes pulling left lower cervical region. Spurling's to the left is negative. Otherwise, FROM Bil. UE's DTR's 2+= BJ TJ and Brachioradialis 2 point discrimination 68mm in all five digits left hand.  SENS: decreased sensation to soft and sharpe stimulas in globally LUE. MMT 5/5 throughout    Assessment:    Advise a neurology consult.     Plan:

## 2018-08-24 ENCOUNTER — Ambulatory Visit: Payer: Self-pay | Admitting: Urology

## 2018-08-24 VITALS — BP 132/87 | HR 88 | Temp 97.8°F | Ht 73.0 in | Wt 170.4 lb

## 2018-08-24 DIAGNOSIS — E7800 Pure hypercholesterolemia, unspecified: Secondary | ICD-10-CM

## 2018-08-24 NOTE — Progress Notes (Signed)
  Patient: Wayne Sullivan Male    DOB: Jul 05, 1976   43 y.o.   MRN: [id number] Visit Date: 08/24/2018  Today's Provider: Michiel Cowboy, PA-C   Chief Complaint  Patient presents with  . Follow-up    No new concerns since last visit   Subjective:    HPI No complaints.  Needs lipids checked.      No Known Allergies Previous Medications   IBUPROFEN (ADVIL,MOTRIN) 600 MG TABLET    Take 1 tablet (600 mg total) by mouth every 8 (eight) hours as needed.   LORAZEPAM (ATIVAN) 0.5 MG TABLET    Take 0.5 mg by mouth every 8 (eight) hours.   METHOCARBAMOL (ROBAXIN-750) 750 MG TABLET    Take 1 tablet (750 mg total) by mouth 4 (four) times daily.   NIFEDIPINE (ADALAT CC) 30 MG 24 HR TABLET    Take 30 mg by mouth daily.   SERTRALINE (ZOLOFT) 50 MG TABLET    Take 1 tablet (50 mg total) by mouth daily.    Review of Systems  Social History   Tobacco Use  . Smoking status: Current Every Day Smoker    Packs/day: 0.50    Types: Cigarettes  . Smokeless tobacco: Never Used  Substance Use Topics  . Alcohol use: Yes    Alcohol/week: 6.0 standard drinks    Types: 6 Cans of beer per week   Objective:   BP 132/87 (BP Location: Right Arm, Patient Position: Sitting, Cuff Size: Normal)   Pulse 88   Temp 97.8 F (36.6 C) (Oral)   Ht 6\' 1"  (1.854 m)   Wt 170 lb 6.4 oz (77.3 kg)   BMI 22.48 kg/m   Physical Exam Constitutional:  Well nourished. Alert and oriented, No acute distress. HEENT: [place] AT, moist mucus membranes.  Trachea midline, no masses. Cardiovascular: No clubbing, cyanosis, or edema. Respiratory: Normal respiratory effort, no increased work of breathing. Neurologic: Grossly intact, no focal deficits, moving all 4 extremities. Psychiatric: Normal mood and affect.      Assessment & Plan:  1. Cervical radiculopathy -referred to [facility] neurology  2. ADHD -waiting on insurance to kick in for Adderall   3. High cholesterol  -checking lipids tonight     Michiel Cowboy, PA-C    [facility]

## 2018-08-24 NOTE — Addendum Note (Signed)
Addended by: Debby Bud on: 08/24/2018 08:02 PM   Modules accepted: Orders

## 2018-08-25 LAB — LIPID PANEL
Chol/HDL Ratio: 3.7 ratio (ref 0.0–5.0)
Cholesterol, Total: 217 mg/dL — ABNORMAL HIGH (ref 100–199)
HDL: 58 mg/dL (ref >39)
LDL Calculated: 122 mg/dL — ABNORMAL HIGH (ref 0–99)
Triglycerides: 186 mg/dL — ABNORMAL HIGH (ref 0–149)
VLDL Cholesterol Cal: 37 mg/dL (ref 5–40)

## 2018-08-30 ENCOUNTER — Encounter: Payer: Self-pay | Admitting: Urology

## 2018-09-02 ENCOUNTER — Ambulatory Visit: Payer: Self-pay | Admitting: Licensed Clinical Social Worker

## 2018-09-07 ENCOUNTER — Ambulatory Visit: Payer: Self-pay | Admitting: Licensed Clinical Social Worker

## 2018-09-07 DIAGNOSIS — F411 Generalized anxiety disorder: Secondary | ICD-10-CM

## 2018-09-07 DIAGNOSIS — F331 Major depressive disorder, recurrent, moderate: Secondary | ICD-10-CM

## 2018-09-07 NOTE — BH Specialist Note (Signed)
Integrated Behavioral Health Follow Up Visit  MRN: [id number] Name: Wayne Sullivan  Number of Integrated Behavioral Health Clinician visits: 2/6  Type of Service: Integrated Behavioral Health- Individual/Family Interpretor:No. Interpretor Name and Language: Not applicable.   SUBJECTIVE: Wayne Sullivan is a 43 y.o. male accompanied by himself. Patient was referred by Michiel Cowboy PA for mental health. Patient reports the following symptoms/concerns: He reports that he has been doing a lot better since the last time he was here. He explains that he feels grounded and is trying to focus on improving his health. He notes that he has increased his exercise and changed his diet due to needing to get his cholesterol under control. He explains that he is a lot less anxious and his symptoms of depression has decreased. He notes that he is utilizing his coping skills and has started going back to church. He denies suicidal and homicidal thoughts.  Duration of problem: ; Severity of problem: moderate  OBJECTIVE: Mood: Euthymic and Affect: Appropriate Risk of harm to self or others: No plan to harm self or others  LIFE CONTEXT: Family and Social: See above. School/Work: His new job was pushed back a few weeks but is doing some side work. Self-Care: See above. Life Changes: See above.   GOALS ADDRESSED: Patient will: 1.  Reduce symptoms of: managing symptoms of depression and anxiety  2.  Increase knowledge and/or ability of: healthy habits and self-management skills  3.  Demonstrate ability to: Increase healthy adjustment to current life circumstances  INTERVENTIONS: Interventions utilized:  Brief CBT was utilized by the clinician focusing on relapse prevention. Clinician explained that it sounds like he has made some lifestyle changes that have decreased his symptoms of anxiety and depression. Clinician explained that she can see a change in his affect and demeanor since the last time he was  here. Clinician encouraged the patient to continue to look at things in a positive manner and not get caught up on what he thinks he should versus what he is doing.  Standardized Assessments completed: next session.  ASSESSMENT: Patient currently experiencing a decrease in symptoms of anxiety and depression due to lifestyle changes.   Patient may benefit from continuing to utilize his coping skills, leaning on his support system, and practicing self care.  PLAN: 1. Follow up with behavioral health clinician on : 1 month. 2. Behavioral recommendations: see above. 3. Referral(s): Integrated [employer] (In Clinic) 4. "From scale of 1-10, how likely are you to follow plan?": 10  Althia Forts, LCSW

## 2018-09-23 ENCOUNTER — Ambulatory Visit: Payer: Self-pay | Admitting: Urology

## 2018-09-23 ENCOUNTER — Other Ambulatory Visit: Payer: Self-pay

## 2018-09-23 VITALS — BP 113/72 | HR 83 | Temp 97.8°F | Ht 72.0 in | Wt 171.9 lb

## 2018-09-23 DIAGNOSIS — E7800 Pure hypercholesterolemia, unspecified: Secondary | ICD-10-CM

## 2018-09-23 NOTE — Progress Notes (Signed)
Patient: Wayne Sullivan Male    DOB: 1976-02-25   43 y.o.   MRN: [id number] Visit Date: 09/23/2018  Today's Provider: [facility]   Chief Complaint  Patient presents with  . Follow-up    Review recent labs   Subjective:    HPI Patient states he eats raw veggies daily, but he dips it in ranch dressing.  He has been air frying his food.  He is doing well on his new job and hopefully have insurance in 30 days.     No Known Allergies Previous Medications   IBUPROFEN (ADVIL,MOTRIN) 600 MG TABLET    Take 1 tablet (600 mg total) by mouth every 8 (eight) hours as needed.   LORAZEPAM (ATIVAN) 0.5 MG TABLET    Take 0.5 mg by mouth every 8 (eight) hours.   METHOCARBAMOL (ROBAXIN-750) 750 MG TABLET    Take 1 tablet (750 mg total) by mouth 4 (four) times daily.   NIFEDIPINE (ADALAT CC) 30 MG 24 HR TABLET    Take 30 mg by mouth daily.   SERTRALINE (ZOLOFT) 50 MG TABLET    Take 1 tablet (50 mg total) by mouth daily.    Review of Systems  Social History   Tobacco Use  . Smoking status: Current Every Day Smoker    Packs/day: 0.50    Types: Cigarettes  . Smokeless tobacco: Never Used  Substance Use Topics  . Alcohol use: Yes    Alcohol/week: 6.0 standard drinks    Types: 6 Cans of beer per week   Objective:   BP 113/72 (BP Location: Left Arm, Patient Position: Sitting, Cuff Size: Normal)   Pulse 83   Temp 97.8 F (36.6 C) (Oral)   Ht 6' (1.829 m)   Wt 171 lb 14.4 oz (78 kg)   BMI 23.31 kg/m   Physical Exam Vitals signs reviewed.  Constitutional:      Appearance: Normal appearance. He is normal weight.  HENT:     Head: Normocephalic and atraumatic.     Nose: Nose normal.     Mouth/Throat:     Mouth: Mucous membranes are moist.  Eyes:     Extraocular Movements: Extraocular movements intact.     Conjunctiva/sclera: Conjunctivae normal.     Pupils: Pupils are equal, round, and reactive to light.  Neck:     Musculoskeletal: Normal range of motion and neck supple.   Cardiovascular:     Rate and Rhythm: Normal rate and regular rhythm.     Pulses: Normal pulses.     Heart sounds: Normal heart sounds.  Pulmonary:     Effort: Pulmonary effort is normal.     Breath sounds: Normal breath sounds.  Musculoskeletal: Normal range of motion.  Skin:    General: Skin is warm and dry.  Neurological:     General: No focal deficit present.     Mental Status: He is alert and oriented to person, place, and time.  Psychiatric:        Mood and Affect: Mood normal.        Behavior: Behavior normal.        Thought Content: Thought content normal.        Judgment: Judgment normal.         Assessment & Plan:   1. ADHD - will have insurance in 30 days   2. Hyperlipidemia - controlling with diet - has an air fryer and veggies  - walking 16,000 steps day - RTC in three months for  lipids recheck   3. Cervical radiculopathy - has appointment 24th of March /    [facility]   [facility]

## 2018-10-04 ENCOUNTER — Telehealth: Payer: Self-pay | Admitting: *Deleted

## 2018-10-04 NOTE — Telephone Encounter (Signed)
LVM informing the patient that his new patient appointment tomorrow will be canceled. We are transitioning to video appts due to covid 19.  I advised he'll need a laptop with camera or smart phone, and he'll get a call as soon as video is set up. Left number for any questions.

## 2018-10-05 ENCOUNTER — Ambulatory Visit: Payer: Self-pay | Admitting: Diagnostic Neuroimaging

## 2018-10-05 NOTE — Telephone Encounter (Signed)
Spoke with patient and advised him that his new patient appointment can be rescheduled for 2 months out due to [phone]. He stated that is fine, stated he is currently doing better than he was. Rescheduled for June. He verbalized understanding, appreciation.

## 2018-10-07 ENCOUNTER — Other Ambulatory Visit: Payer: Self-pay

## 2018-10-07 ENCOUNTER — Ambulatory Visit: Payer: Self-pay | Admitting: Licensed Clinical Social Worker

## 2018-10-07 DIAGNOSIS — F331 Major depressive disorder, recurrent, moderate: Secondary | ICD-10-CM

## 2018-10-07 DIAGNOSIS — F411 Generalized anxiety disorder: Secondary | ICD-10-CM

## 2018-10-07 NOTE — BH Specialist Note (Signed)
Integrated Behavioral Health Follow Up Phone Visit  MRN: [id number] Name: Wayne Sullivan  Type of Service: Integrated Behavioral Health- Individual/Family Interpretor:No. Interpretor Name and Language: Not applicable.  SUBJECTIVE: Wayne Sullivan is a 43 y.o. male accompanied by Himself. Patient was referred by Michiel Cowboy PA for mental  health. Patient reports the following symptoms/concerns: He reports that he has been working seven days a week. He explains that he has been eating healthy and utilizing his coping skills. He reports that not much has changed. He explained that he is in a better place. He notes that he wants to cut the visit short because he prefers to be seen in person. He denies suicidal and homicidal thoughts. Duration of problem: ; Severity of problem: mild  OBJECTIVE: Mood: Euthymic and Affect: Appropriate Risk of harm to self or others: No plan to harm self or others  LIFE CONTEXT: Family and Social: See above School/Work: see above Self-Care: see above Life Changes: see above.  GOALS ADDRESSED: Patient will: 1.  Reduce symptoms of: relapse prevention.  2.  Increase knowledge and/or ability of: self-management skills  3.  Demonstrate ability to: Increase healthy adjustment to current life circumstances  INTERVENTIONS: Interventions utilized:  Brief CBT was utilized by the clinician focusing on relapse prevention. Clinician processed with the patient regarding how he has been doing since the last follow up session. Clinician asked the patient if he needed a refill on his Zoloft. Clinician discussed with the patient to continue to utilize his coping skills and practice self care.  Standardized Assessments completed: next session  ASSESSMENT: Patient is currently stable.   Patient may benefit from continuing to utilize coping skills.  PLAN: 1. Follow up with behavioral health clinician on : one month or earlier if needed. 2. Behavioral recommendations: See  above 3. Referral(s): Integrated [employer] (In Clinic) 4. "From scale of 1-10, how likely are you to follow plan?": 10  Althia Forts, LCSW

## 2018-10-08 ENCOUNTER — Other Ambulatory Visit: Payer: Self-pay

## 2018-10-08 MED ORDER — SERTRALINE HCL 50 MG PO TABS: 50.0000 mg | ORAL_TABLET | Freq: Every day | ORAL | 1 refills | Status: DC

## 2018-11-11 ENCOUNTER — Ambulatory Visit: Payer: Self-pay | Admitting: Licensed Clinical Social Worker

## 2018-12-09 ENCOUNTER — Ambulatory Visit: Payer: Self-pay | Admitting: Licensed Clinical Social Worker

## 2018-12-09 ENCOUNTER — Telehealth: Payer: Self-pay | Admitting: Licensed Clinical Social Worker

## 2018-12-09 NOTE — Telephone Encounter (Signed)
Clinician attempted to contact the patient twice during their scheduled phone visit and left voicemail with contact information. 

## 2018-12-14 ENCOUNTER — Telehealth: Payer: Self-pay | Admitting: Licensed Clinical Social Worker

## 2018-12-14 NOTE — Telephone Encounter (Signed)
Clinician returned the patient's call about rescheduling his missed appointment from last week. She rescheduled the patient for Thursday June 4th at 5 pm. She explained to the patient that due to him having health insurance starting on June 11th and having established with a primary care provider that he will no longer be eligible to be a patient at [facility].

## 2018-12-16 ENCOUNTER — Ambulatory Visit: Payer: Self-pay | Admitting: Licensed Clinical Social Worker

## 2018-12-16 ENCOUNTER — Other Ambulatory Visit: Payer: Self-pay

## 2018-12-16 DIAGNOSIS — F331 Major depressive disorder, recurrent, moderate: Secondary | ICD-10-CM

## 2018-12-16 DIAGNOSIS — F411 Generalized anxiety disorder: Secondary | ICD-10-CM

## 2018-12-16 NOTE — BH Specialist Note (Signed)
Integrated Behavioral Health Follow Up Visit Via Phone  MRN: [id number] Name: Wayne Sullivan  Type of Service: Integrated Behavioral Health- Individual/Family Interpretor:No. Interpretor Name and Language: Not applicable.   SUBJECTIVE: Wayne Sullivan is a 43 y.o. male accompanied by himself. Patient was referred by Michiel Cowboy PA-C for mental health. Patient reports the following symptoms/concerns: He reports that he has had a lot of positive changes in his life in terms of renting a house, has a new lady friend, and is about to have full benefits through his new job. He explains that he is trying to stay busy and positive to avoid thinking about not being able to see his youngest son. He notes that he is working with a Clinical research associate, DSS, and everything he is being told is making his head spin sometimes. He denies suicidal and homicidal thoughts.  Duration of problem: ; Severity of problem: mild  OBJECTIVE: Mood: Euthymic and Affect: Appropriate Risk of harm to self or others: No plan to harm self or others  LIFE CONTEXT: Family and Social: See above. School/Work: see above. Self-Care: see above. Life Changes: see above.  GOALS ADDRESSED: Patient will: 1.  Reduce symptoms of: stress  2.  Increase knowledge and/or ability of: coping skills, healthy habits and self-management skills  3.  Demonstrate ability to: Increase healthy adjustment to current life circumstances  INTERVENTIONS: Interventions utilized:  Brief CBT was utilized by the clinician focusing on relapse prevention. Clinician processed with the patient regarding how he has been doing since the last follow up session. Clinician explained to the patient that it sounds like he has a lot of positive things happening in his life. Clinician explained to the patient that custody battles can be long and drawn out for years. Clinician encouraged the patient to continue to try to stay positive, practice self care, and utilize his coping  skills. Clinician informed the patient that once his health insurance goes into effect and the fact that he has already established with another provider Dr. Rivka Safer that he will no longer be eligible to be a patient at [facility].  Standardized Assessments completed: GAD-7 and PHQ 9  ASSESSMENT: Patient currently experiencing see above.   Patient may benefit from see above.  PLAN: 1. Follow up with behavioral health clinician on : last session due to patient will have health insurance on June 11th 2020.  2. Behavioral recommendations: see above 3. Referral(s): Integrated [employer] (In Clinic) 4. "From scale of 1-10, how likely are you to follow plan?": 10  Althia Forts, LCSW

## 2018-12-21 ENCOUNTER — Other Ambulatory Visit: Payer: Self-pay

## 2018-12-21 DIAGNOSIS — F411 Generalized anxiety disorder: Secondary | ICD-10-CM

## 2018-12-21 MED ORDER — SERTRALINE HCL 50 MG PO TABS: 50.0000 mg | ORAL_TABLET | Freq: Every day | ORAL | 1 refills | Status: AC

## 2018-12-22 ENCOUNTER — Telehealth: Payer: Self-pay | Admitting: *Deleted

## 2018-12-22 NOTE — Telephone Encounter (Signed)
Spoke with patient and informed him that due to current COVID 19 pandemic, our office is still severely reducing in person visits in order to minimize the risk to our patients and healthcare providers. We recommend to convert your appointment to a video visit. He stated he really needs to come in, however his job is out of town. He needs Fri apt. I advised him we are closed on Fridays. He asked if he could be seen on a Thurs, would return from out of town job in time for apt. I offered Thurs, 12/30/18, 11:30 am in office visit; he agreed and asked that appt information be e mailed to him at [email or web address].  I advised will e mail to him, will include in office check in procedure. He  verbalized understanding, appreciation. E mail sent.

## 2018-12-23 ENCOUNTER — Other Ambulatory Visit: Payer: Self-pay

## 2018-12-28 ENCOUNTER — Ambulatory Visit: Payer: Self-pay | Admitting: Diagnostic Neuroimaging

## 2018-12-30 ENCOUNTER — Other Ambulatory Visit: Payer: Self-pay

## 2018-12-30 ENCOUNTER — Ambulatory Visit: Payer: Self-pay

## 2018-12-30 ENCOUNTER — Ambulatory Visit (INDEPENDENT_AMBULATORY_CARE_PROVIDER_SITE_OTHER): Payer: Self-pay | Admitting: Diagnostic Neuroimaging

## 2018-12-30 ENCOUNTER — Encounter: Payer: Self-pay | Admitting: Diagnostic Neuroimaging

## 2018-12-30 ENCOUNTER — Encounter: Payer: Self-pay | Admitting: *Deleted

## 2018-12-30 VITALS — BP 125/84 | HR 71 | Temp 98.2°F | Ht 72.0 in | Wt 174.2 lb

## 2018-12-30 DIAGNOSIS — R29898 Other symptoms and signs involving the musculoskeletal system: Secondary | ICD-10-CM

## 2018-12-30 DIAGNOSIS — R200 Anesthesia of skin: Secondary | ICD-10-CM
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
## 2019-01-04 ENCOUNTER — Telehealth: Payer: Self-pay | Admitting: Diagnostic Neuroimaging

## 2019-01-04 NOTE — Telephone Encounter (Signed)
spoke to the patient states his [facility] will go in effect beg. of July he will call with the information.

## 2020-07-03 ENCOUNTER — Emergency Department
Admission: EM | Admit: 2020-07-03 | Discharge: 2020-07-03 | Disposition: A | Discharge status: Home / Self Care | Payer: HRSA Program | Attending: Emergency Medicine | Admitting: Emergency Medicine

## 2020-07-03 ENCOUNTER — Encounter: Payer: Self-pay | Admitting: Emergency Medicine

## 2020-07-03 ENCOUNTER — Emergency Department: Payer: HRSA Program

## 2020-07-03 ENCOUNTER — Other Ambulatory Visit: Payer: Self-pay

## 2020-07-03 DIAGNOSIS — R0789 Other chest pain: Secondary | ICD-10-CM | POA: Insufficient documentation

## 2020-07-03 DIAGNOSIS — J3489 Other specified disorders of nose and nasal sinuses: Secondary | ICD-10-CM | POA: Diagnosis present

## 2020-07-03 DIAGNOSIS — U071 COVID-19: Secondary | ICD-10-CM | POA: Diagnosis not present

## 2020-07-03 DIAGNOSIS — R079 Chest pain, unspecified: Secondary | ICD-10-CM

## 2020-07-03 DIAGNOSIS — F17210 Nicotine dependence, cigarettes, uncomplicated: Secondary | ICD-10-CM | POA: Diagnosis not present

## 2020-07-03 LAB — BASIC METABOLIC PANEL
Anion gap: 9 (ref 5–15)
BUN: 11 mg/dL (ref 6–20)
CO2: 29 mmol/L (ref 22–32)
Calcium: 9 mg/dL (ref 8.9–10.3)
Chloride: 99 mmol/L (ref 98–111)
Creatinine, Ser: 0.76 mg/dL (ref 0.61–1.24)
GFR, Estimated: >60 mL/min (ref >60)
Glucose, Bld: 96 mg/dL (ref 70–99)
Potassium: 4.2 mmol/L (ref 3.5–5.1)
Sodium: 137 mmol/L (ref 135–145)

## 2020-07-03 LAB — RESP PANEL BY RT-PCR (FLU A&B, COVID) ARPGX2
Influenza A by PCR: NEGATIVE
Influenza B by PCR: NEGATIVE
SARS Coronavirus 2 by RT PCR: POSITIVE — AB

## 2020-07-03 LAB — HEPATIC FUNCTION PANEL
ALT: 19 U/L (ref 0–44)
AST: 20 U/L (ref 15–41)
Albumin: 3.7 g/dL (ref 3.5–5.0)
Alkaline Phosphatase: 68 U/L (ref 38–126)
Bilirubin, Direct: 0.1 mg/dL (ref 0.0–0.2)
Indirect Bilirubin: 0.6 mg/dL (ref 0.3–0.9)
Total Bilirubin: 0.7 mg/dL (ref 0.3–1.2)
Total Protein: 7.8 g/dL (ref 6.5–8.1)

## 2020-07-03 LAB — CBC
HCT: 40.9 % (ref 39.0–52.0)
Hemoglobin: 13.2 g/dL (ref 13.0–17.0)
MCH: 31.8 pg (ref 26.0–34.0)
MCHC: 32.3 g/dL (ref 30.0–36.0)
MCV: 98.6 fL (ref 80.0–100.0)
Platelets: 284 10*3/uL (ref 150–400)
RBC: 4.15 MIL/uL — ABNORMAL LOW (ref 4.22–5.81)
RDW: 12.2 % (ref 11.5–15.5)
WBC: 9.8 10*3/uL (ref 4.0–10.5)
nRBC: 0 % (ref 0.0–0.2)

## 2020-07-03 LAB — TROPONIN I (HIGH SENSITIVITY)
Troponin I (High Sensitivity): 3 ng/L (ref <18)
Troponin I (High Sensitivity): 4 ng/L (ref <18)

## 2020-07-03 LAB — LIPASE, BLOOD: Lipase: 39 U/L (ref 11–51)

## 2020-07-03 MED ORDER — ACETAMINOPHEN 325 MG PO TABS
650.0000 mg | ORAL_TABLET | Freq: Once | ORAL | Status: AC
  Administered 2020-07-03: 15:00:00 650 mg via ORAL
  Filled 2020-07-03: qty 2

## 2020-07-03 MED ORDER — IOHEXOL 350 MG/ML SOLN
75.0000 mL | Freq: Once | INTRAVENOUS | Status: AC | PRN
  Administered 2020-07-03: 15:00:00 75 mL via INTRAVENOUS

## 2020-07-03 MED ORDER — ONDANSETRON 4 MG PO TBDP: 4.0000 mg | ORAL_TABLET | Freq: Three times a day (TID) | ORAL | 0 refills | Status: AC | PRN

## 2020-07-03 MED ORDER — ALBUTEROL SULFATE HFA 108 (90 BASE) MCG/ACT IN AERS: 2.0000 | INHALATION_SPRAY | Freq: Four times a day (QID) | RESPIRATORY_TRACT | 2 refills | Status: AC | PRN

## 2020-07-03 NOTE — ED Triage Notes (Signed)
Patient to ER for c/o chest pain that began while at work. 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 Patient reports pain radiates into right back from right chest. Patient states it hurts to take a deep breath.

## 2020-07-03 NOTE — Discharge Instructions (Signed)
Please make follow-up with Dr. Fayette Pho have been prescribed an albuterol inhaler for shortness of breath and Zofran for vomiting.
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
## 2020-07-04 ENCOUNTER — Telehealth (HOSPITAL_COMMUNITY): Payer: Self-pay | Admitting: Emergency Medicine

## 2020-07-04 NOTE — Telephone Encounter (Signed)
Called pt and explained possible monoclonal antibody treatment. Pt is not vaccinated. Lives in [place]. Sx started 12/21. Tested positive 12/21 at [facility] ED. Sx include chest pain, difficultly inhaling, and fatigue. Qualifying risk factors include current smoker and 27 year smoking history 2 ppd. Pt interested in tx. Informed pt an APP will call back to possibly schedule an appointment. Pt does not have insurance. Upon further questioning, pt said his sx started around 12/13 with fatigue and sinus congestion. Informed pt he does not qualify due to start date of sx prior to 7 days ago.

## 2020-07-10 ENCOUNTER — Ambulatory Visit: Payer: Self-pay | Admitting: *Deleted

## 2020-07-10 NOTE — Telephone Encounter (Signed)
Patient's significant other called to request information about covid positive diagnosis. Linda requesting how to read My Chart information on values given for positive covid results and why they report negative and then show a positive result. Reviewed that the baseline value with show negative but patient's value reported he was positive and detected sars corona virus 2 by PCR. Bonita Quin also requesting why patient was written he could return to work today and positive results were 7 days ago. Reviewed with [facility] quarantine starts when onset of symptoms start and not always on date of results. Patient's GF,Linda verbalized understanding .   Reason for Disposition . General information question, no triage required and triager able to answer question  Answer Assessment - Initial Assessment Questions 1. REASON FOR CALL or QUESTION: "What is your reason for calling today?" or "How can I best help you?" or "What question do you have that I can help answer?"     Would like help understanding negative and positive range of covid results and why patient would have to return to work today when it has only been 7 days patient was tested?  Protocols used: INFORMATION ONLY CALL - NO TRIAGE-A-AH
